# Patient Record
Sex: Male | Born: 1960
Health system: Southern US, Community
[De-identification: ages and names within clinical notes are randomized; demographics above are authoritative.]

## PROBLEM LIST (undated history)

## (undated) HISTORY — PX: OTHER SURGICAL HISTORY: SHX169

---

## 2002-08-02 ENCOUNTER — Encounter: Admission: RE | Admit: 2002-08-02 | Discharge: 2002-08-02 | Payer: Self-pay | Admitting: Family Medicine

## 2016-11-20 ENCOUNTER — Ambulatory Visit
Admission: RE | Admit: 2016-11-20 | Discharge: 2016-11-20 | Disposition: A | Discharge status: Home / Self Care | Payer: Self-pay | Source: Ambulatory Visit | Attending: Family Medicine | Admitting: Family Medicine

## 2016-11-20 ENCOUNTER — Other Ambulatory Visit: Payer: Self-pay | Admitting: Family Medicine

## 2016-11-20 DIAGNOSIS — R42 Dizziness and giddiness: Secondary | ICD-10-CM | POA: Diagnosis not present

## 2016-11-20 DIAGNOSIS — M542 Cervicalgia: Secondary | ICD-10-CM

## 2018-03-30 DIAGNOSIS — M545 Low back pain: Secondary | ICD-10-CM | POA: Diagnosis not present

## 2018-03-30 DIAGNOSIS — M62838 Other muscle spasm: Secondary | ICD-10-CM | POA: Diagnosis not present

## 2018-04-12 IMAGING — CR DG CERVICAL SPINE COMPLETE 4+V
7 series · 7 of 7 positions shown · non-contrast
Comparison: None.

CLINICAL DATA: Neck pain

EXAM:
CERVICAL SPINE - COMPLETE 4+ VIEW

[w cervical spine lat]
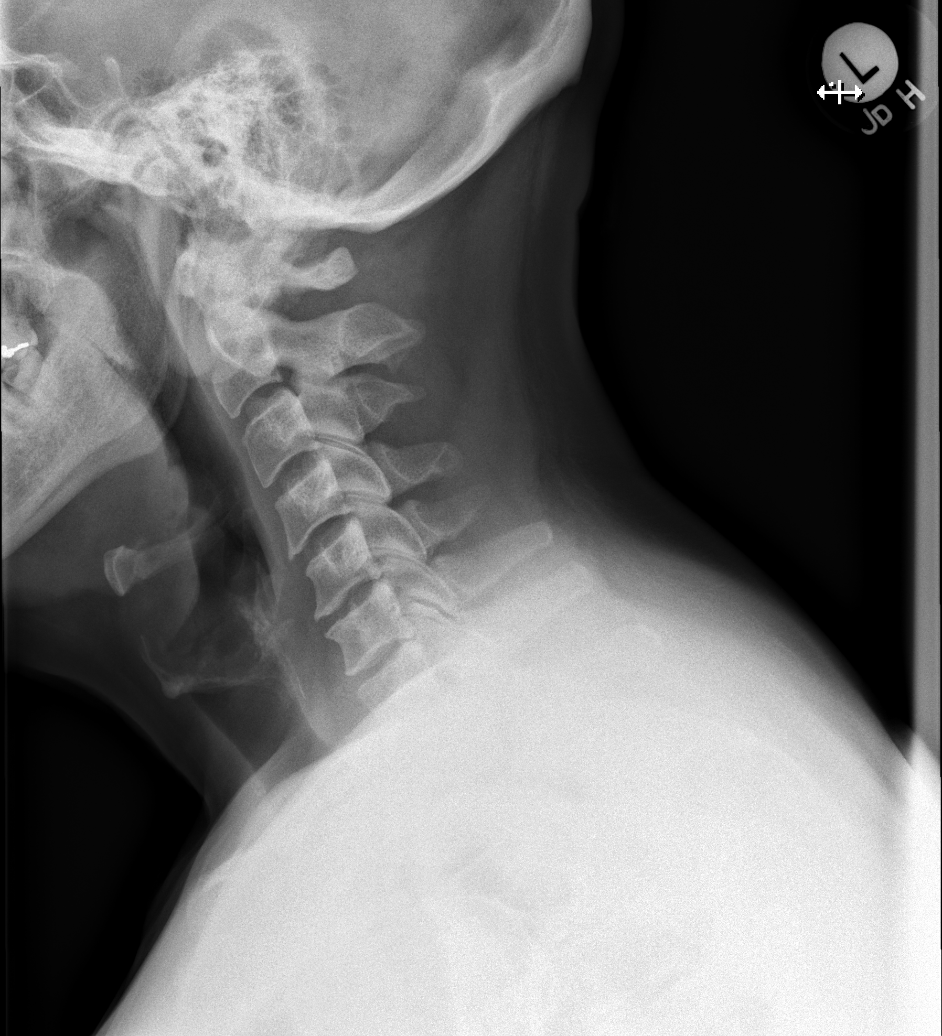

[w cervical spine ap_obl (1 of 2)]
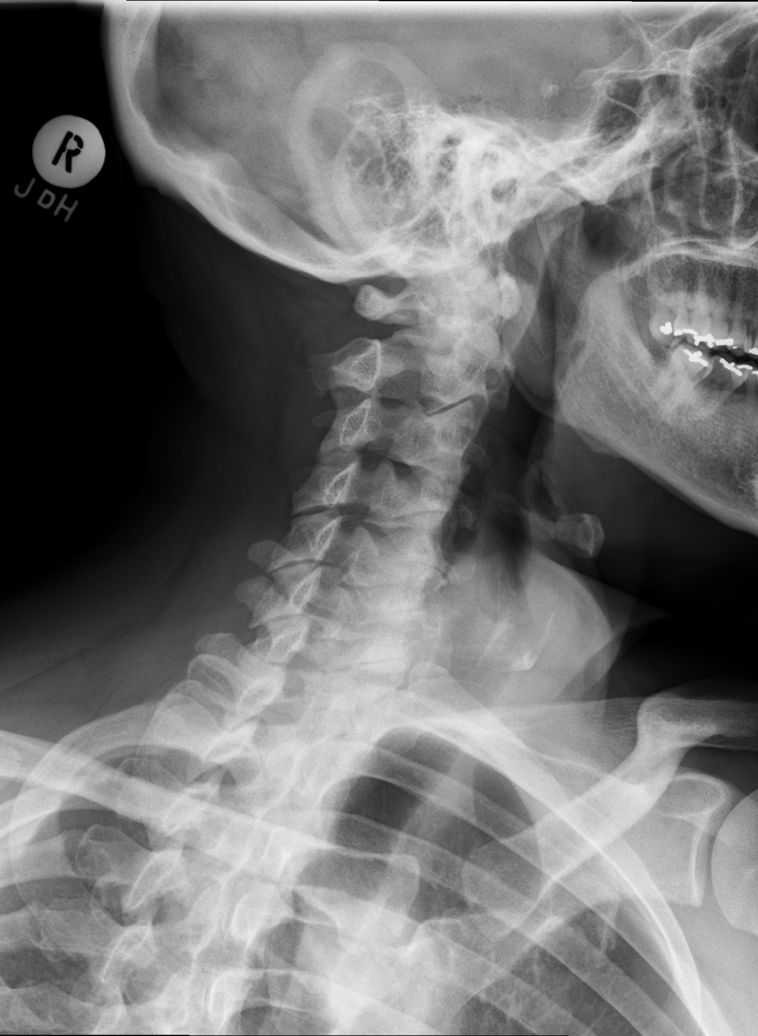

[w cervical spine ap_obl (2 of 2)]
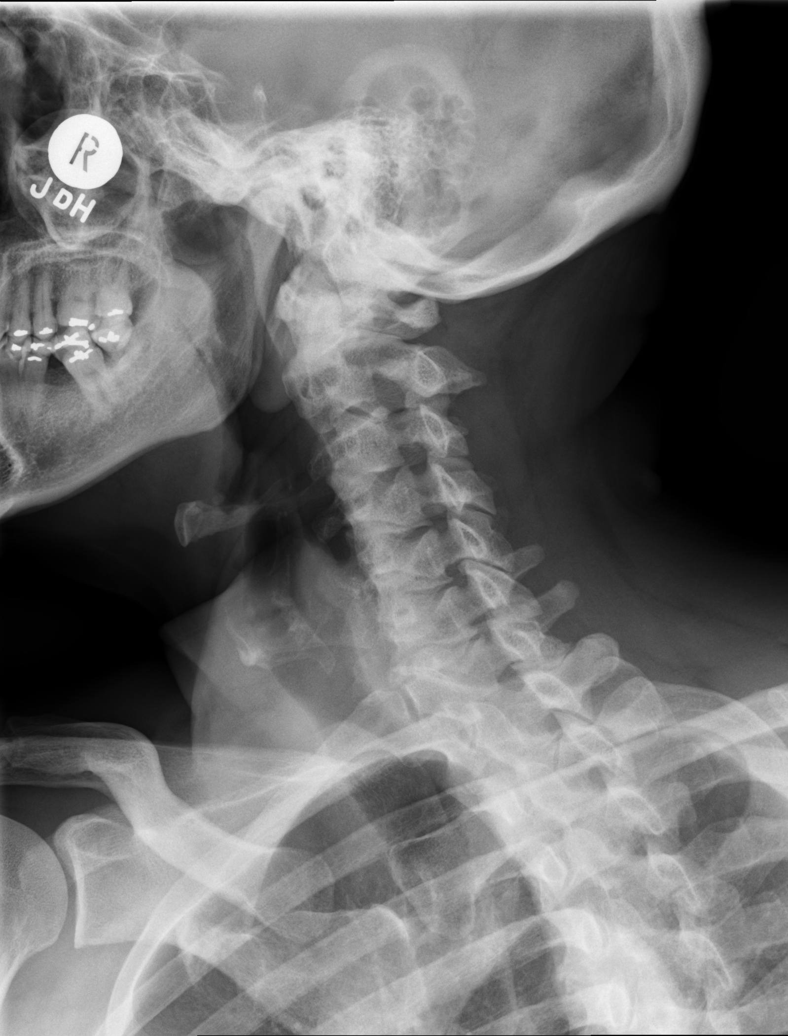

[w cervical spine ap]
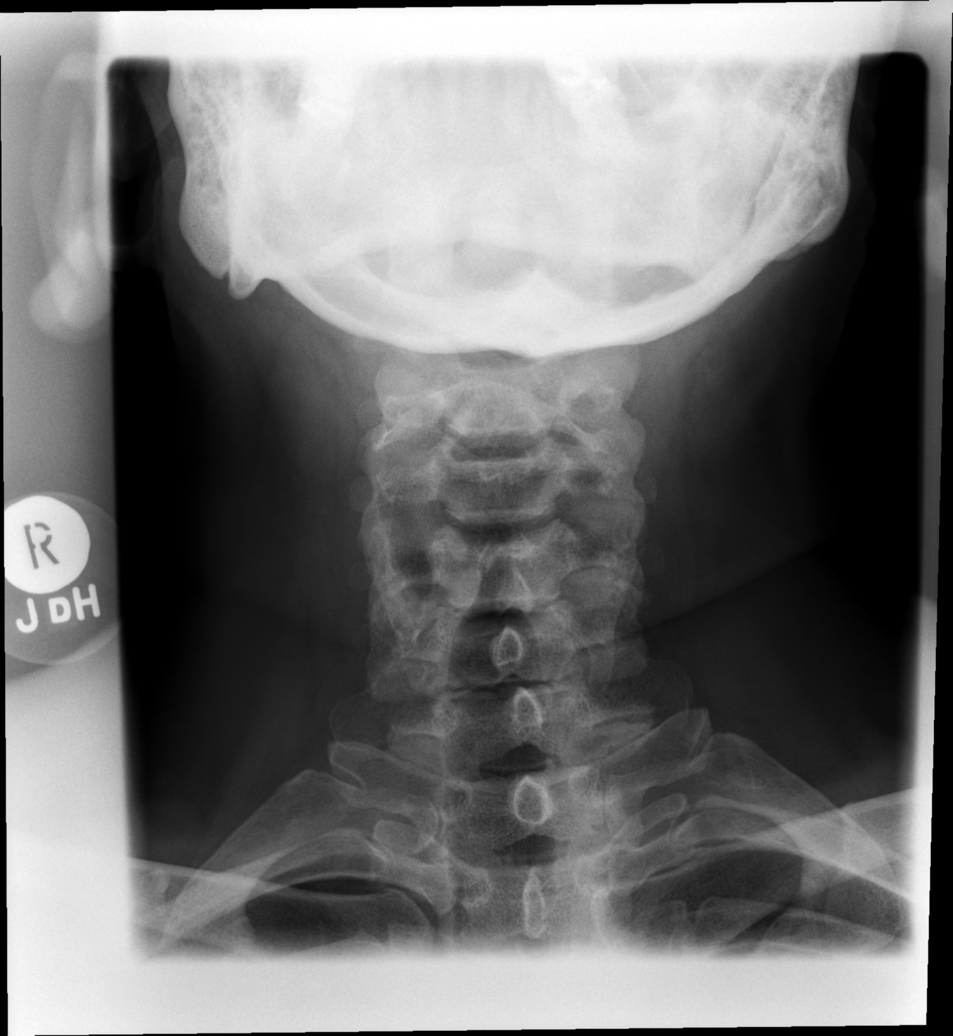

[w cervical swimmers]
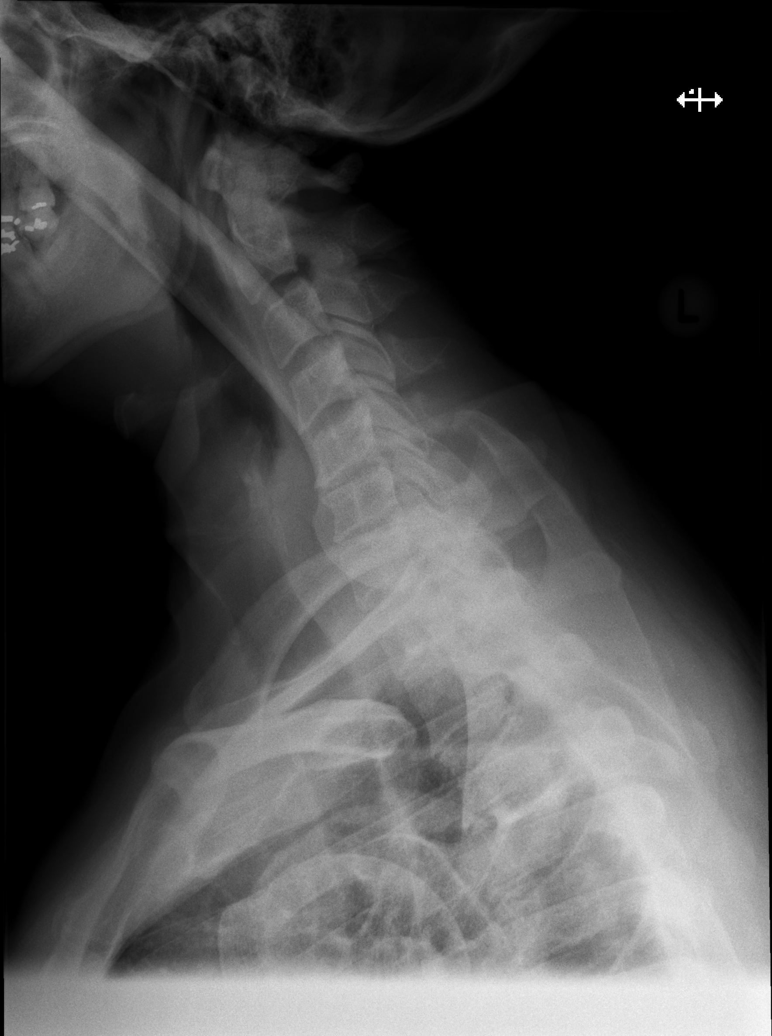

[t cervical spine odontoid (1 of 2)]
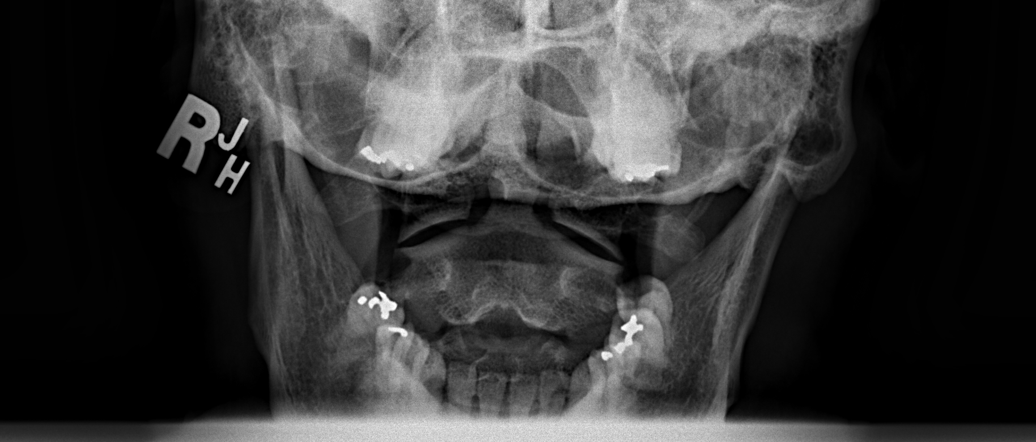

[t cervical spine odontoid (2 of 2)]
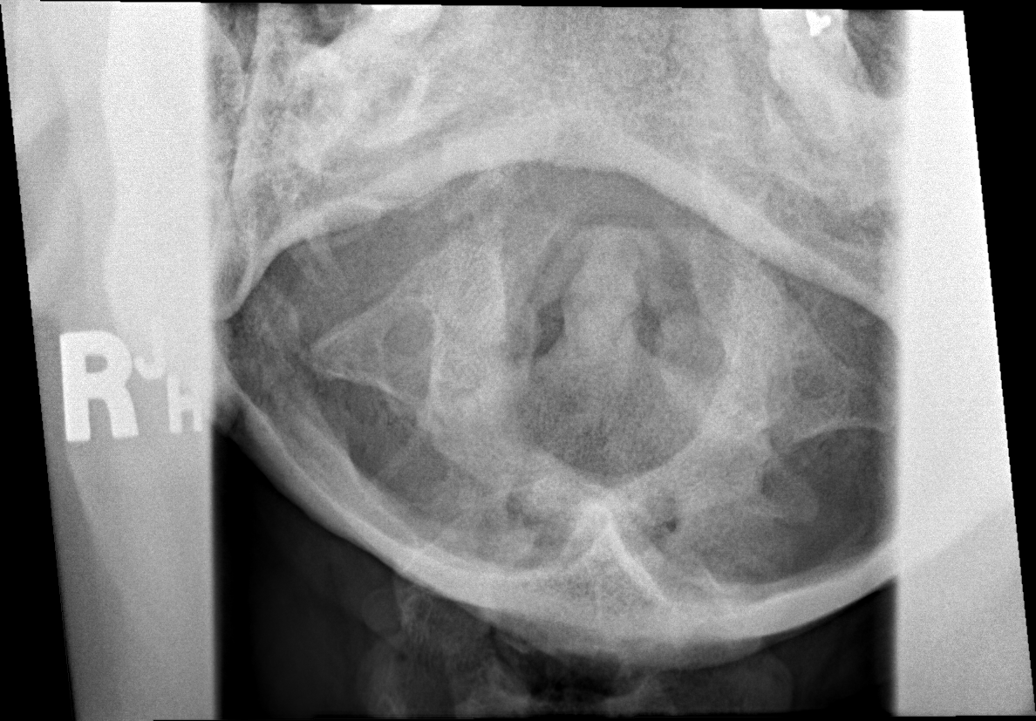

[7 of 7 positions shown; findings below may reference images not displayed]

FINDINGS: Mild disc narrowing and endplate spurring at C5-6 and C6-7. Minimal
facet spurring at the same levels. At least mild osseous foraminal
narrowing bilaterally at C5-6 and C6-7. No fracture deformity or
endplate erosion. No evidence of focal bone lesion. No prevertebral
thickening. Apical lungs are clear.
IMPRESSION: 1. No acute finding.
2. Mild degenerative spurring at C5-6 and C6-7 with mild bilateral
foraminal narrowing.

## 2018-10-20 DIAGNOSIS — M545 Low back pain: Secondary | ICD-10-CM | POA: Diagnosis not present

## 2018-10-25 DIAGNOSIS — R102 Pelvic and perineal pain: Secondary | ICD-10-CM | POA: Diagnosis not present

## 2018-11-02 DIAGNOSIS — M5416 Radiculopathy, lumbar region: Secondary | ICD-10-CM | POA: Diagnosis not present

## 2018-11-16 DIAGNOSIS — J301 Allergic rhinitis due to pollen: Secondary | ICD-10-CM | POA: Diagnosis not present

## 2018-11-16 DIAGNOSIS — M5416 Radiculopathy, lumbar region: Secondary | ICD-10-CM | POA: Diagnosis not present

## 2019-01-13 DIAGNOSIS — Z0000 Encounter for general adult medical examination without abnormal findings: Secondary | ICD-10-CM | POA: Diagnosis not present

## 2019-01-13 DIAGNOSIS — E782 Mixed hyperlipidemia: Secondary | ICD-10-CM | POA: Diagnosis not present

## 2019-01-13 DIAGNOSIS — G5602 Carpal tunnel syndrome, left upper limb: Secondary | ICD-10-CM | POA: Diagnosis not present

## 2019-01-13 DIAGNOSIS — Z125 Encounter for screening for malignant neoplasm of prostate: Secondary | ICD-10-CM | POA: Diagnosis not present

## 2019-04-06 DIAGNOSIS — A084 Viral intestinal infection, unspecified: Secondary | ICD-10-CM | POA: Diagnosis not present

## 2019-04-06 DIAGNOSIS — R197 Diarrhea, unspecified: Secondary | ICD-10-CM | POA: Diagnosis not present

## 2019-05-09 DIAGNOSIS — R0781 Pleurodynia: Secondary | ICD-10-CM | POA: Diagnosis not present

## 2019-06-28 DIAGNOSIS — Z23 Encounter for immunization: Secondary | ICD-10-CM | POA: Diagnosis not present

## 2019-08-25 DIAGNOSIS — R509 Fever, unspecified: Secondary | ICD-10-CM | POA: Diagnosis not present

## 2019-08-25 DIAGNOSIS — R52 Pain, unspecified: Secondary | ICD-10-CM | POA: Diagnosis not present

## 2019-08-25 DIAGNOSIS — R6883 Chills (without fever): Secondary | ICD-10-CM | POA: Diagnosis not present

## 2019-08-25 DIAGNOSIS — J0100 Acute maxillary sinusitis, unspecified: Secondary | ICD-10-CM | POA: Diagnosis not present

## 2019-09-15 ENCOUNTER — Other Ambulatory Visit: Payer: Self-pay

## 2019-09-15 DIAGNOSIS — Z20822 Contact with and (suspected) exposure to covid-19: Secondary | ICD-10-CM

## 2019-09-18 LAB — NOVEL CORONAVIRUS, NAA: SARS-CoV-2, NAA: NOT DETECTED

## 2020-01-16 DIAGNOSIS — R197 Diarrhea, unspecified: Secondary | ICD-10-CM | POA: Diagnosis not present

## 2020-01-16 DIAGNOSIS — Z566 Other physical and mental strain related to work: Secondary | ICD-10-CM | POA: Diagnosis not present

## 2020-05-04 DIAGNOSIS — Z125 Encounter for screening for malignant neoplasm of prostate: Secondary | ICD-10-CM | POA: Diagnosis not present

## 2020-05-04 DIAGNOSIS — R1314 Dysphagia, pharyngoesophageal phase: Secondary | ICD-10-CM | POA: Diagnosis not present

## 2020-05-04 DIAGNOSIS — K2100 Gastro-esophageal reflux disease with esophagitis, without bleeding: Secondary | ICD-10-CM | POA: Diagnosis not present

## 2020-05-22 DIAGNOSIS — R1314 Dysphagia, pharyngoesophageal phase: Secondary | ICD-10-CM | POA: Diagnosis not present

## 2020-05-22 DIAGNOSIS — M79604 Pain in right leg: Secondary | ICD-10-CM | POA: Diagnosis not present

## 2020-05-22 DIAGNOSIS — R768 Other specified abnormal immunological findings in serum: Secondary | ICD-10-CM | POA: Diagnosis not present

## 2020-05-22 DIAGNOSIS — K2100 Gastro-esophageal reflux disease with esophagitis, without bleeding: Secondary | ICD-10-CM | POA: Diagnosis not present

## 2020-06-25 DIAGNOSIS — K219 Gastro-esophageal reflux disease without esophagitis: Secondary | ICD-10-CM | POA: Diagnosis not present

## 2020-06-25 DIAGNOSIS — R1310 Dysphagia, unspecified: Secondary | ICD-10-CM | POA: Diagnosis not present

## 2020-06-25 DIAGNOSIS — Z01818 Encounter for other preprocedural examination: Secondary | ICD-10-CM | POA: Diagnosis not present

## 2020-07-23 DIAGNOSIS — Z1159 Encounter for screening for other viral diseases: Secondary | ICD-10-CM | POA: Diagnosis not present

## 2020-07-26 DIAGNOSIS — D123 Benign neoplasm of transverse colon: Secondary | ICD-10-CM | POA: Diagnosis not present

## 2020-07-26 DIAGNOSIS — R12 Heartburn: Secondary | ICD-10-CM | POA: Diagnosis not present

## 2020-07-26 DIAGNOSIS — K5730 Diverticulosis of large intestine without perforation or abscess without bleeding: Secondary | ICD-10-CM | POA: Diagnosis not present

## 2020-07-26 DIAGNOSIS — R1310 Dysphagia, unspecified: Secondary | ICD-10-CM | POA: Diagnosis not present

## 2020-07-26 DIAGNOSIS — K2900 Acute gastritis without bleeding: Secondary | ICD-10-CM | POA: Diagnosis not present

## 2020-07-26 DIAGNOSIS — Z1211 Encounter for screening for malignant neoplasm of colon: Secondary | ICD-10-CM | POA: Diagnosis not present

## 2020-07-26 DIAGNOSIS — K635 Polyp of colon: Secondary | ICD-10-CM | POA: Diagnosis not present

## 2020-07-26 DIAGNOSIS — K219 Gastro-esophageal reflux disease without esophagitis: Secondary | ICD-10-CM | POA: Diagnosis not present

## 2020-07-26 DIAGNOSIS — K640 First degree hemorrhoids: Secondary | ICD-10-CM | POA: Diagnosis not present

## 2020-09-05 DIAGNOSIS — Z03818 Encounter for observation for suspected exposure to other biological agents ruled out: Secondary | ICD-10-CM | POA: Diagnosis not present

## 2020-09-05 DIAGNOSIS — Z20822 Contact with and (suspected) exposure to covid-19: Secondary | ICD-10-CM | POA: Diagnosis not present

## 2020-09-25 DIAGNOSIS — Z20822 Contact with and (suspected) exposure to covid-19: Secondary | ICD-10-CM | POA: Diagnosis not present

## 2020-10-11 DIAGNOSIS — Z20822 Contact with and (suspected) exposure to covid-19: Secondary | ICD-10-CM | POA: Diagnosis not present

## 2021-06-17 ENCOUNTER — Other Ambulatory Visit: Payer: Self-pay

## 2021-06-17 ENCOUNTER — Encounter: Payer: Self-pay | Admitting: Internal Medicine

## 2021-06-17 ENCOUNTER — Ambulatory Visit: Payer: BC Managed Care – PPO | Admitting: Internal Medicine

## 2021-06-17 VITALS — BP 122/80 | HR 62 | Temp 98.7°F | Resp 18 | Ht 66.0 in | Wt 200.0 lb

## 2021-06-17 DIAGNOSIS — Z0000 Encounter for general adult medical examination without abnormal findings: Secondary | ICD-10-CM

## 2021-06-17 LAB — COMPREHENSIVE METABOLIC PANEL
ALT: 19 U/L (ref 0–53)
AST: 19 U/L (ref 0–37)
Albumin: 4.2 g/dL (ref 3.5–5.2)
Alkaline Phosphatase: 70 U/L (ref 39–117)
BUN: 18 mg/dL (ref 6–23)
CO2: 29 mEq/L (ref 19–32)
Calcium: 9.1 mg/dL (ref 8.4–10.5)
Chloride: 104 mEq/L (ref 96–112)
Creatinine, Ser: 0.75 mg/dL (ref 0.40–1.50)
GFR: 97.59 mL/min (ref >60.00)
Glucose, Bld: 101 mg/dL — ABNORMAL HIGH (ref 70–99)
Potassium: 3.5 mEq/L (ref 3.5–5.1)
Sodium: 139 mEq/L (ref 135–145)
Total Bilirubin: 0.9 mg/dL (ref 0.2–1.2)
Total Protein: 6.7 g/dL (ref 6.0–8.3)

## 2021-06-17 LAB — CBC
HCT: 38.5 % — ABNORMAL LOW (ref 39.0–52.0)
Hemoglobin: 13.1 g/dL (ref 13.0–17.0)
MCHC: 34 g/dL (ref 30.0–36.0)
MCV: 82.9 fl (ref 78.0–100.0)
Platelets: 180 10*3/uL (ref 150.0–400.0)
RBC: 4.65 Mil/uL (ref 4.22–5.81)
RDW: 13.6 % (ref 11.5–15.5)
WBC: 4.8 10*3/uL (ref 4.0–10.5)

## 2021-06-17 LAB — LIPID PANEL
Cholesterol: 170 mg/dL (ref 0–200)
HDL: 46.1 mg/dL (ref >39.00)
NonHDL: 124.28
Total CHOL/HDL Ratio: 4
Triglycerides: 231 mg/dL — ABNORMAL HIGH (ref 0.0–149.0)
VLDL: 46.2 mg/dL — ABNORMAL HIGH (ref 0.0–40.0)

## 2021-06-17 LAB — LDL CHOLESTEROL, DIRECT: Direct LDL: 98 mg/dL

## 2021-06-17 LAB — HEMOGLOBIN A1C: Hgb A1c MFr Bld: 5.5 % (ref 4.6–6.5)

## 2021-06-17 NOTE — Assessment & Plan Note (Signed)
Flu shot yearly. Covid-19 booster counseled. Shingrix complete. Tetanus up to date per his recollection. Colonoscopy up to date will get records. Counseled about sun safety and mole surveillance. Counseled about the dangers of distracted driving. Given 10 year screening recommendations.

## 2021-06-17 NOTE — Patient Instructions (Addendum)
We will check the labs today. 

## 2021-06-17 NOTE — Progress Notes (Signed)
   Subjective:   Patient ID: Jordan Villa, male    DOB: June 11, 1961, 60 y.o.   MRN: [id number]  HPI The patient is a new 60 YO man coming in for wellness.   PMH, [facility], social history reviewed and updated  Review of Systems  Constitutional: Negative.   HENT: Negative.    Eyes: Negative.   Respiratory:  Negative for cough, chest tightness and shortness of breath.   Cardiovascular:  Negative for chest pain, palpitations and leg swelling.  Gastrointestinal:  Negative for abdominal distention, abdominal pain, constipation, diarrhea, nausea and vomiting.  Musculoskeletal: Negative.   Skin: Negative.   Neurological: Negative.   Psychiatric/Behavioral: Negative.     Objective:  Physical Exam Constitutional:      Appearance: He is well-developed.  HENT:     Head: Normocephalic and atraumatic.  Cardiovascular:     Rate and Rhythm: Normal rate and regular rhythm.  Pulmonary:     Effort: Pulmonary effort is normal. No respiratory distress.     Breath sounds: Normal breath sounds. No wheezing or rales.  Abdominal:     General: Bowel sounds are normal. There is no distension.     Palpations: Abdomen is soft.     Tenderness: There is no abdominal tenderness. There is no rebound.  Musculoskeletal:     Cervical back: Normal range of motion.  Skin:    General: Skin is warm and dry.  Neurological:     Mental Status: He is alert and oriented to person, place, and time.     Coordination: Coordination normal.    Vitals:   06/17/21 1412  BP: 122/80  Pulse: 62  Resp: 18  Temp: 98.7 F (37.1 C)  TempSrc: Oral  SpO2: 98%  Weight: 200 lb (90.7 kg)  Height: 5\' 6"  (1.676 m)    This visit occurred during the SARS-CoV-2 public health emergency.  Safety protocols were in place, including screening questions prior to the visit, additional usage of staff PPE, and extensive cleaning of exam room while observing appropriate contact time as indicated for disinfecting solutions.   Assessment &  Plan:

## 2021-09-02 DIAGNOSIS — Z20822 Contact with and (suspected) exposure to covid-19: Secondary | ICD-10-CM | POA: Diagnosis not present

## 2022-03-25 ENCOUNTER — Ambulatory Visit: Payer: BC Managed Care – PPO | Admitting: Internal Medicine

## 2022-03-28 ENCOUNTER — Ambulatory Visit: Payer: BC Managed Care – PPO | Admitting: Internal Medicine

## 2022-03-31 ENCOUNTER — Ambulatory Visit: Payer: BC Managed Care – PPO | Admitting: Internal Medicine

## 2022-03-31 ENCOUNTER — Encounter: Payer: Self-pay | Admitting: Internal Medicine

## 2022-03-31 VITALS — BP 112/70 | HR 52 | Resp 18 | Ht 66.0 in | Wt 196.8 lb

## 2022-03-31 DIAGNOSIS — M542 Cervicalgia: Secondary | ICD-10-CM | POA: Diagnosis not present

## 2022-03-31 DIAGNOSIS — G8929 Other chronic pain: Secondary | ICD-10-CM

## 2022-03-31 MED ORDER — PREDNISONE 20 MG PO TABS: 40.0000 mg | ORAL_TABLET | Freq: Every day | ORAL | 0 refills | Status: DC
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
## 2022-04-03 DIAGNOSIS — G8929 Other chronic pain: Secondary | ICD-10-CM | POA: Insufficient documentation

## 2022-04-03 NOTE — Assessment & Plan Note (Signed)
Rx prednisone 40 mg daily 5 day course and referral to neurosurgery as this is an acute on chronic problem. Has past imaging will not update today. No red flag signs such as weakness or numbness in the hands/arms.

## 2022-05-02 DIAGNOSIS — M542 Cervicalgia: Secondary | ICD-10-CM | POA: Diagnosis not present

## 2022-05-05 DIAGNOSIS — Z03818 Encounter for observation for suspected exposure to other biological agents ruled out: Secondary | ICD-10-CM | POA: Diagnosis not present

## 2022-05-05 DIAGNOSIS — J029 Acute pharyngitis, unspecified: Secondary | ICD-10-CM | POA: Diagnosis not present

## 2022-05-05 DIAGNOSIS — J069 Acute upper respiratory infection, unspecified: Secondary | ICD-10-CM | POA: Diagnosis not present

## 2022-05-05 DIAGNOSIS — Z681 Body mass index (BMI) 19 or less, adult: Secondary | ICD-10-CM | POA: Diagnosis not present

## 2022-05-07 DIAGNOSIS — U071 COVID-19: Secondary | ICD-10-CM | POA: Diagnosis not present

## 2022-05-07 DIAGNOSIS — J3489 Other specified disorders of nose and nasal sinuses: Secondary | ICD-10-CM | POA: Diagnosis not present

## 2022-05-07 DIAGNOSIS — R0981 Nasal congestion: Secondary | ICD-10-CM | POA: Diagnosis not present

## 2022-05-07 DIAGNOSIS — R051 Acute cough: Secondary | ICD-10-CM | POA: Diagnosis not present

## 2022-05-08 ENCOUNTER — Telehealth: Payer: BC Managed Care – PPO | Admitting: Family Medicine

## 2022-05-14 ENCOUNTER — Ambulatory Visit: Payer: BC Managed Care – PPO

## 2022-05-28 ENCOUNTER — Other Ambulatory Visit: Payer: Self-pay

## 2022-05-28 ENCOUNTER — Ambulatory Visit: Payer: BC Managed Care – PPO | Attending: Neurosurgery

## 2022-05-28 DIAGNOSIS — R252 Cramp and spasm: Secondary | ICD-10-CM | POA: Diagnosis not present

## 2022-05-28 DIAGNOSIS — M542 Cervicalgia: Secondary | ICD-10-CM | POA: Insufficient documentation

## 2022-05-28 DIAGNOSIS — R293 Abnormal posture: Secondary | ICD-10-CM | POA: Diagnosis not present
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
## 2022-06-11 ENCOUNTER — Ambulatory Visit: Payer: BC Managed Care – PPO | Attending: Neurosurgery

## 2022-06-11 DIAGNOSIS — M542 Cervicalgia: Secondary | ICD-10-CM | POA: Insufficient documentation

## 2022-06-11 DIAGNOSIS — R293 Abnormal posture: Secondary | ICD-10-CM | POA: Insufficient documentation

## 2022-06-11 DIAGNOSIS — R252 Cramp and spasm: Secondary | ICD-10-CM | POA: Insufficient documentation
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
## 2022-06-17 ENCOUNTER — Ambulatory Visit: Payer: BC Managed Care – PPO

## 2022-06-17 DIAGNOSIS — R293 Abnormal posture: Secondary | ICD-10-CM

## 2022-06-17 DIAGNOSIS — M542 Cervicalgia: Secondary | ICD-10-CM

## 2022-06-17 DIAGNOSIS — R252 Cramp and spasm: Secondary | ICD-10-CM | POA: Diagnosis not present
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
## 2022-06-19 ENCOUNTER — Ambulatory Visit: Payer: BC Managed Care – PPO | Admitting: Physical Therapy

## 2022-06-19 DIAGNOSIS — R293 Abnormal posture: Secondary | ICD-10-CM | POA: Diagnosis not present

## 2022-06-19 DIAGNOSIS — R252 Cramp and spasm: Secondary | ICD-10-CM

## 2022-06-19 DIAGNOSIS — M542 Cervicalgia: Secondary | ICD-10-CM
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
## 2022-06-23 ENCOUNTER — Encounter: Payer: Self-pay | Admitting: Internal Medicine

## 2022-06-23 ENCOUNTER — Ambulatory Visit (INDEPENDENT_AMBULATORY_CARE_PROVIDER_SITE_OTHER): Payer: BC Managed Care – PPO | Admitting: Internal Medicine

## 2022-06-23 VITALS — BP 118/60 | HR 72 | Ht 65.5 in | Wt 195.0 lb

## 2022-06-23 DIAGNOSIS — Z23 Encounter for immunization: Secondary | ICD-10-CM | POA: Diagnosis not present

## 2022-06-23 DIAGNOSIS — Z0000 Encounter for general adult medical examination without abnormal findings: Secondary | ICD-10-CM | POA: Diagnosis not present

## 2022-06-23 NOTE — Progress Notes (Signed)
   Subjective:   Patient ID: Jordan Villa, male    DOB: 1961-02-11, 61 y.o.   MRN: [id number]  HPI The patient is here for physical.  PMH, [facility], social history reviewed and updated  Review of Systems  Objective:  Physical Exam  Vitals:   06/23/22 1509  BP: 118/60  Pulse: 72  SpO2: 98%  Weight: 195 lb (88.5 kg)  Height: 5' 5.5" (1.664 m)    Assessment & Plan:  Flu and shingrix IM given at visit

## 2022-06-24 ENCOUNTER — Ambulatory Visit: Payer: BC Managed Care – PPO

## 2022-06-24 DIAGNOSIS — M542 Cervicalgia: Secondary | ICD-10-CM | POA: Diagnosis not present

## 2022-06-24 DIAGNOSIS — R252 Cramp and spasm: Secondary | ICD-10-CM | POA: Diagnosis not present

## 2022-06-24 DIAGNOSIS — R293 Abnormal posture: Secondary | ICD-10-CM
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
## 2022-06-26 NOTE — Assessment & Plan Note (Signed)
Flu shot given. Covid-19 counseled. Shingrix given 1st. Tetanus up to date. Colonoscopy up to date. Counseled about sun safety and mole surveillance. Counseled about the dangers of distracted driving. Given 10 year screening recommendations.

## 2022-07-14 DIAGNOSIS — M542 Cervicalgia: Secondary | ICD-10-CM | POA: Diagnosis not present

## 2022-07-14 DIAGNOSIS — Z6832 Body mass index (BMI) 32.0-32.9, adult: Secondary | ICD-10-CM | POA: Diagnosis not present

## 2022-08-25 ENCOUNTER — Telehealth: Payer: BC Managed Care – PPO | Admitting: Physician Assistant

## 2022-08-25 DIAGNOSIS — U071 COVID-19: Secondary | ICD-10-CM

## 2022-08-25 MED ORDER — MOLNUPIRAVIR EUA 200MG CAPSULE: 4.0000 | ORAL_CAPSULE | Freq: Two times a day (BID) | ORAL | 0 refills | Status: AC

## 2022-08-25 MED ORDER — PROMETHAZINE-DM 6.25-15 MG/5ML PO SYRP: 5.0000 mL | ORAL_SOLUTION | Freq: Four times a day (QID) | ORAL | 0 refills | Status: DC | PRN

## 2022-08-25 NOTE — Progress Notes (Signed)
Virtual Visit Consent   Jordan Villa, you are scheduled for a virtual visit with a [facility] provider today. Just as with appointments in the office, your consent must be obtained to participate. Your consent will be active for this visit and any virtual visit you may have with one of our providers in the next 365 days. If you have a MyChart account, a copy of this consent can be sent to you electronically.  As this is a virtual visit, video technology does not allow for your provider to perform a traditional examination. This may limit your provider's ability to fully assess your condition. If your provider identifies any concerns that need to be evaluated in person or the need to arrange testing (such as labs, EKG, etc.), we will make arrangements to do so. Although advances in technology are sophisticated, we cannot ensure that it will always work on either your end or our end. If the connection with a video visit is poor, the visit may have to be switched to a telephone visit. With either a video or telephone visit, we are not always able to ensure that we have a secure connection.  By engaging in this virtual visit, you consent to the provision of healthcare and authorize for your insurance to be billed (if applicable) for the services provided during this visit. Depending on your insurance coverage, you may receive a charge related to this service.  I need to obtain your verbal consent now. Are you willing to proceed with your visit today? Jordan Villa has provided verbal consent on 08/25/2022 for a virtual visit (video or telephone). Margaretann Loveless, PA-C  Date: 08/25/2022 1:22 PM  Virtual Visit via Video Note   I, Margaretann Loveless, connected with  Jordan Villa  ([id number], Jun 21, 1961) on 08/25/22 at  1:15 PM EST by a video-enabled telemedicine application and verified that I am speaking with the correct person using two identifiers.  Location: Patient: Virtual Visit Location  Patient: Home Provider: Virtual Visit Location Provider: Home Office   I discussed the limitations of evaluation and management by telemedicine and the availability of in person appointments. The patient expressed understanding and agreed to proceed.    History of Present Illness: Jordan Villa is a 61 y.o. who identifies as a male who was assigned male at birth, and is being seen today for Covid 43.  HPI: URI  This is a new problem. The current episode started in the past 7 days (Tested positive on at home test today for Covid 19; symptoms started on Friday). The problem has been gradually worsening. Maximum temperature: Subjective fevers. Associated symptoms include congestion, coughing (productive just to today), ear pain, headaches, a plugged ear sensation, rhinorrhea and sinus pain. Pertinent negatives include no diarrhea, nausea, sneezing, sore throat, vomiting or wheezing. Associated symptoms comments: Myalgias, chills. He has tried acetaminophen for the symptoms. The treatment provided no relief.     Problems:  Patient Active Problem List   Diagnosis Date Noted   Chronic neck pain 04/03/2022   Routine general medical examination at a health care facility 06/17/2021    Allergies: No Known Allergies Medications:  Current Outpatient Medications:    molnupiravir EUA (LAGEVRIO) 200 mg CAPS capsule, Take 4 capsules (800 mg total) by mouth 2 (two) times daily for 5 days., Disp: 40 capsule, Rfl: 0   promethazine-dextromethorphan (PROMETHAZINE-DM) 6.25-15 MG/5ML syrup, Take 5 mLs by mouth 4 (four) times daily as needed., Disp: 118 mL, Rfl: 0  Observations/Objective: Patient is well-developed, well-nourished in no acute distress.  Resting comfortably at home.  Head is normocephalic, atraumatic.  No labored breathing.  Speech is clear and coherent with logical content.  Patient is alert and oriented at baseline.    Assessment and Plan: 1. COVID-19 - molnupiravir EUA (LAGEVRIO) 200 mg  CAPS capsule; Take 4 capsules (800 mg total) by mouth 2 (two) times daily for 5 days.  Dispense: 40 capsule; Refill: 0 - promethazine-dextromethorphan (PROMETHAZINE-DM) 6.25-15 MG/5ML syrup; Take 5 mLs by mouth 4 (four) times daily as needed.  Dispense: 118 mL; Refill: 0  - Continue OTC symptomatic management of choice - Will send OTC vitamins and supplement information through AVS - Molnupiravir and Promethazine DM prescribed - Patient enrolled in MyChart symptom monitoring - Push fluids - Rest as needed - Discussed return precautions and when to seek in-person evaluation, sent via AVS as well   Follow Up Instructions: I discussed the assessment and treatment plan with the patient. The patient was provided an opportunity to ask questions and all were answered. The patient agreed with the plan and demonstrated an understanding of the instructions.  A copy of instructions were sent to the patient via MyChart unless otherwise noted below.    The patient was advised to call back or seek an in-person evaluation if the symptoms worsen or if the condition fails to improve as anticipated.  Time:  I spent 10 minutes with the patient via telehealth technology discussing the above problems/concerns.    Margaretann Loveless, PA-C

## 2022-08-25 NOTE — Patient Instructions (Signed)
Jordan Villa, thank you for joining Margaretann Loveless, PA-C for today's virtual visit.  While this provider is not your primary care provider (PCP), if your PCP is located in our provider database this encounter information will be shared with them immediately following your visit.   A [facility] MyChart account gives you access to today's visit and all your visits, tests, and labs performed at [facility] " click here if you don't have a [facility] MyChart account or go to [email or web address]  Consent: (Patient) Jordan Villa provided verbal consent for this virtual visit at the beginning of the encounter.  Current Medications:  Current Outpatient Medications:    molnupiravir EUA (LAGEVRIO) 200 mg CAPS capsule, Take 4 capsules (800 mg total) by mouth 2 (two) times daily for 5 days., Disp: 40 capsule, Rfl: 0   promethazine-dextromethorphan (PROMETHAZINE-DM) 6.25-15 MG/5ML syrup, Take 5 mLs by mouth 4 (four) times daily as needed., Disp: 118 mL, Rfl: 0   Medications ordered in this encounter:  Meds ordered this encounter  Medications   molnupiravir EUA (LAGEVRIO) 200 mg CAPS capsule    Sig: Take 4 capsules (800 mg total) by mouth 2 (two) times daily for 5 days.    Dispense:  40 capsule    Refill:  0    Order Specific Question:   Supervising Provider    Answer:   Merrilee Jansky [id number]   promethazine-dextromethorphan (PROMETHAZINE-DM) 6.25-15 MG/5ML syrup    Sig: Take 5 mLs by mouth 4 (four) times daily as needed.    Dispense:  118 mL    Refill:  0    Order Specific Question:   Supervising Provider    Answer:   Merrilee Jansky [[id number]]     *If you need refills on other medications prior to your next appointment, please contact your pharmacy*  Follow-Up: Call back or seek an in-person evaluation if the symptoms worsen or if the condition fails to improve as anticipated.  [facility] [phone]  Other  Instructions  COVID-19 COVID-19, or coronavirus disease 2019, is an infection that is caused by a new (novel) coronavirus called SARS-CoV-2. COVID-19 can cause many symptoms. In some people, the virus may not cause any symptoms. In others, it may cause mild or severe symptoms. Some people with severe infection develop severe disease. What are the causes? This illness is caused by a virus. The virus may be in the air as tiny specks of fluid (aerosols) or droplets, or it may be on surfaces. You may catch the virus by: Breathing in droplets from an infected person. Droplets can be spread by a person breathing, speaking, singing, coughing, or sneezing. Touching something, like a table or a doorknob, that has virus on it (is contaminated) and then touching your mouth, nose, or eyes. What increases the risk? Risk for infection: You are more likely to get infected with the COVID-19 virus if: You are within 6 ft (1.8 m) of a person with COVID-19 for 15 minutes or longer. You are providing care for a person who is infected with COVID-19. You are in close personal contact with other people. Close personal contact includes hugging, kissing, or sharing eating or drinking utensils. Risk for serious illness caused by COVID-19: You are more likely to get seriously ill from the COVID-19 virus if: You have cancer. You have a long-term (chronic) disease, such as: Chronic lung disease. This includes pulmonary embolism, chronic obstructive pulmonary disease, and cystic fibrosis. Long-term disease that lowers  your body's ability to fight infection (immunocompromise). Serious cardiac conditions, such as heart failure, coronary artery disease, or cardiomyopathy. Diabetes. Chronic kidney disease. Liver diseases. These include cirrhosis, nonalcoholic fatty liver disease, alcoholic liver disease, or autoimmune hepatitis. You have obesity. You are pregnant or were recently pregnant. You have sickle cell  disease. What are the signs or symptoms? Symptoms of this condition can range from mild to severe. Symptoms may appear any time from 2 to 14 days after being exposed to the virus. They include: Fever or chills. Shortness of breath or trouble breathing. Feeling tired or very tired. Headaches, body aches, or muscle aches. Runny or stuffy nose, sneezing, coughing, or sore throat. New loss of taste or smell. This is rare. Some people may also have stomach problems, such as nausea, vomiting, or diarrhea. Other people may not have any symptoms of COVID-19. How is this diagnosed? This condition may be diagnosed by testing samples to check for the COVID-19 virus. The most common tests are the PCR test and the antigen test. Tests may be done in the lab or at home. They include: Using a swab to take a sample of fluid from the back of your nose and throat (nasopharyngeal fluid), from your nose, or from your throat. Testing a sample of saliva from your mouth. Testing a sample of coughed-up mucus from your lungs (sputum). How is this treated? Treatment for COVID-19 infection depends on the severity of the condition. Mild symptoms can be managed at home with rest, fluids, and over-the-counter medicines. Serious symptoms may be treated in a hospital intensive care unit (ICU). Treatment in the ICU may include: Supplemental oxygen. Extra oxygen is given through a tube in the nose, a face mask, or a hood. Medicines. These may include: Antivirals, such as monoclonal antibodies. These help your body fight off certain viruses that can cause disease. Anti-inflammatories, such as corticosteroids. These reduce inflammation and suppress the immune system. Antithrombotics. These prevent or treat blood clots, if they develop. Convalescent plasma. This helps boost your immune system, if you have an underlying immunosuppressive condition or are getting immunosuppressive treatments. Prone positioning. This means you  will lie on your stomach. This helps oxygen to get into your lungs. Infection control measures. If you are at risk for more serious illness caused by COVID-19, your health care provider may prescribe two long-acting monoclonal antibodies, given together every 6 months. How is this prevented? To protect yourself: Use preventive medicine (pre-exposure prophylaxis). You may get pre-exposure prophylaxis if you have moderate or severe immunocompromise. Get vaccinated. Anyone 15 months old or older who meets guidelines can get a COVID-19 vaccine or vaccine series. This includes people who are pregnant or making breast milk (lactating). Get an added dose of COVID-19 vaccine after your first vaccine or vaccine series if you have moderate to severe immunocompromise. This applies if you have had a solid organ transplant or have been diagnosed with an immunocompromising condition. You should get the added dose 4 weeks after you got the first COVID-19 vaccine or vaccine series. If you get an mRNA vaccine, you will need a 3-dose primary series. If you get the J&J/Janssen vaccine, you will need a 2-dose primary series, with the second dose being an mRNA vaccine. Talk to your health care provider about getting experimental monoclonal antibodies. This treatment is approved under emergency use authorization to prevent severe illness before or after being exposed to the COVID-19 virus. You may be given monoclonal antibodies if: You have moderate or severe immunocompromise.  This includes treatments that lower your immune response. People with immunocompromise may not develop protection against COVID-19 when they are vaccinated. You cannot be vaccinated. You may not get a vaccine if you have a severe allergic reaction to the vaccine or its components. You are not fully vaccinated. You are in a facility where COVID-19 is present and: Are in close contact with a person who is infected with the COVID-19 virus. Are at high  risk of being exposed to the COVID-19 virus. You are at risk of illness from new variants of the COVID-19 virus. To protect others: If you have symptoms of COVID-19, take steps to prevent the virus from spreading to others. Stay home. Leave your house only to get medical care. Do not use public transit, if possible. Do not travel while you are sick. Wash your hands often with soap and water for at least 20 seconds. If soap and water are not available, use alcohol-based hand sanitizer. Make sure that all people in your household wash their hands well and often. Cough or sneeze into a tissue or your sleeve or elbow. Do not cough or sneeze into your hand or into the air. Where to find more information Centers for Disease Control and Prevention: https://www.clark-whitaker.org/ World Health Organization: https://thompson-craig.com/ Get help right away if: You have trouble breathing. You have pain or pressure in your chest. You are confused. You have bluish lips and fingernails. You have trouble waking from sleep. You have symptoms that get worse. These symptoms may be an emergency. Get help right away. Call 911. Do not wait to see if the symptoms will go away. Do not drive yourself to the hospital. Summary COVID-19 is an infection that is caused by a new coronavirus. Sometimes, there are no symptoms. Other times, symptoms range from mild to severe. Some people with a severe COVID-19 infection develop severe disease. The virus that causes COVID-19 can spread from person to person through droplets or aerosols from breathing, speaking, singing, coughing, or sneezing. Mild symptoms of COVID-19 can be managed at home with rest, fluids, and over-the-counter medicines. This information is not intended to replace advice given to you by your health care provider. Make sure you discuss any questions you have with your health care provider. Document Revised: 09/10/2021 Document Reviewed:  09/12/2021 Elsevier Patient Education  2023 Elsevier Inc.    If you have been instructed to have an in-person evaluation today at a local Urgent Care facility, please use the link below. It will take you to a list of all of our available [facility], including address, phone number and hours of operation. Please do not delay care.  [facility]  If you or a family member do not have a primary care provider, use the link below to schedule a visit and establish care. When you choose a [facility] primary care physician or advanced practice provider, you gain a long-term partner in health. Find a Primary Care Provider  Learn more about [facility]'s in-office and virtual care options: [facility] Get Care Now

## 2022-09-22 ENCOUNTER — Ambulatory Visit (INDEPENDENT_AMBULATORY_CARE_PROVIDER_SITE_OTHER): Payer: BC Managed Care – PPO

## 2022-09-22 DIAGNOSIS — Z23 Encounter for immunization: Secondary | ICD-10-CM | POA: Diagnosis not present

## 2022-09-22 NOTE — Progress Notes (Signed)
T was given 2nd shingles vacc w/o any complications.

## 2022-10-28 ENCOUNTER — Ambulatory Visit: Payer: BC Managed Care – PPO | Admitting: Internal Medicine

## 2022-11-04 ENCOUNTER — Ambulatory Visit: Payer: BC Managed Care – PPO | Admitting: Internal Medicine

## 2022-11-04 ENCOUNTER — Encounter: Payer: Self-pay | Admitting: Internal Medicine

## 2022-11-04 VITALS — BP 120/78 | HR 55 | Temp 98.5°F | Ht 65.5 in | Wt 193.0 lb

## 2022-11-04 DIAGNOSIS — K625 Hemorrhage of anus and rectum: Secondary | ICD-10-CM

## 2022-11-04 LAB — CBC
HCT: 39.6 % (ref 39.0–52.0)
Hemoglobin: 13.7 g/dL (ref 13.0–17.0)
MCHC: 34.6 g/dL (ref 30.0–36.0)
MCV: 82.6 fl (ref 78.0–100.0)
Platelets: 190 10*3/uL (ref 150.0–400.0)
RBC: 4.79 Mil/uL (ref 4.22–5.81)
RDW: 13.9 % (ref 11.5–15.5)
WBC: 4.9 10*3/uL (ref 4.0–10.5)

## 2022-11-04 LAB — FERRITIN: Ferritin: 94 ng/mL (ref 22.0–322.0)

## 2022-11-04 NOTE — Assessment & Plan Note (Signed)
Has hemorrhoids which could be cause. Last colonoscopy he thinks 1 year ago at [facility]. We do not have records getting those to check for known diverticulosis. No signs of diverticulitis on exam. Last episode of bleeding about 2 weeks ago. Checking CBC and ferritin. If significant drop will refer back to GI. If not will monitor and review colonoscopy findings.

## 2022-11-04 NOTE — Progress Notes (Signed)
   Subjective:   Patient ID: Jordan Villa, male    DOB: 11/24/1960, 62 y.o.   MRN: [id number]  HPI The patient is a 62 YO man coming in for rectal bleeding twice last time 2 weeks ago. Last colonoscopy 1 year ago due again in 3 years.  Review of Systems  Constitutional: Negative.   HENT: Negative.    Eyes: Negative.   Respiratory:  Negative for cough, chest tightness and shortness of breath.   Cardiovascular:  Negative for chest pain, palpitations and leg swelling.  Gastrointestinal:  Positive for blood in stool. Negative for abdominal distention, abdominal pain, constipation, diarrhea, nausea and vomiting.  Musculoskeletal: Negative.   Skin: Negative.   Neurological: Negative.   Psychiatric/Behavioral: Negative.      Objective:  Physical Exam Constitutional:      Appearance: He is well-developed.  HENT:     Head: Normocephalic and atraumatic.  Cardiovascular:     Rate and Rhythm: Normal rate and regular rhythm.  Pulmonary:     Effort: Pulmonary effort is normal. No respiratory distress.     Breath sounds: Normal breath sounds. No wheezing or rales.  Abdominal:     General: Bowel sounds are normal. There is no distension.     Palpations: Abdomen is soft.     Tenderness: There is no abdominal tenderness. There is no rebound.  Musculoskeletal:     Cervical back: Normal range of motion.  Skin:    General: Skin is warm and dry.  Neurological:     Mental Status: He is alert and oriented to person, place, and time.     Coordination: Coordination normal.     Vitals:   11/04/22 0839  BP: 120/78  Pulse: (!) 55  Temp: 98.5 F (36.9 C)  TempSrc: Oral  SpO2: 98%  Weight: 193 lb (87.5 kg)  Height: 5' 5.5" (1.664 m)    Assessment & Plan:

## 2022-11-04 NOTE — Patient Instructions (Signed)
We will check the labs today and get the records of the colonoscopy.

## 2023-05-21 ENCOUNTER — Encounter (INDEPENDENT_AMBULATORY_CARE_PROVIDER_SITE_OTHER): Payer: Self-pay

## 2023-06-29 ENCOUNTER — Encounter: Payer: BC Managed Care – PPO | Admitting: Internal Medicine

## 2023-07-13 ENCOUNTER — Encounter: Payer: BC Managed Care – PPO | Admitting: Internal Medicine

## 2023-07-27 ENCOUNTER — Encounter: Payer: Self-pay | Admitting: Internal Medicine

## 2023-07-27 ENCOUNTER — Ambulatory Visit: Payer: Self-pay | Admitting: Internal Medicine

## 2024-03-09 ENCOUNTER — Ambulatory Visit (INDEPENDENT_AMBULATORY_CARE_PROVIDER_SITE_OTHER): Payer: Self-pay | Admitting: Internal Medicine

## 2024-03-09 ENCOUNTER — Ambulatory Visit (INDEPENDENT_AMBULATORY_CARE_PROVIDER_SITE_OTHER): Payer: Self-pay

## 2024-03-09 ENCOUNTER — Encounter: Payer: Self-pay | Admitting: Internal Medicine

## 2024-03-09 VITALS — BP 118/80 | HR 53 | Temp 97.8°F | Ht 65.5 in | Wt 179.0 lb

## 2024-03-09 DIAGNOSIS — M79642 Pain in left hand: Secondary | ICD-10-CM

## 2024-03-09 MED ORDER — MELOXICAM 15 MG PO TABS: 15.0000 mg | ORAL_TABLET | Freq: Every day | ORAL | 0 refills | Status: AC

## 2024-03-09 NOTE — Progress Notes (Unsigned)
   Subjective:   Patient ID: Jordan Villa, male    DOB: 22-Jul-1961, 63 y.o.   MRN: [id number]  HPI The patient is a 63 YO male coming in for left hand pain. Started about 1 month ago and is stable and worsening slightly. New job in the last few months and uses hands daily which has caused pain.   Review of Systems  Constitutional: Negative.   HENT: Negative.    Eyes: Negative.   Respiratory:  Negative for cough, chest tightness and shortness of breath.   Cardiovascular:  Negative for chest pain, palpitations and leg swelling.  Gastrointestinal:  Negative for abdominal distention, abdominal pain, constipation, diarrhea, nausea and vomiting.  Musculoskeletal: Negative.   Skin: Negative.   Neurological: Negative.   Psychiatric/Behavioral: Negative.      Objective:  Physical Exam Constitutional:      Appearance: He is well-developed.  HENT:     Head: Normocephalic and atraumatic.  Cardiovascular:     Rate and Rhythm: Normal rate and regular rhythm.  Pulmonary:     Effort: Pulmonary effort is normal. No respiratory distress.     Breath sounds: Normal breath sounds. No wheezing or rales.  Abdominal:     General: Bowel sounds are normal. There is no distension.     Palpations: Abdomen is soft.     Tenderness: There is no abdominal tenderness. There is no rebound.  Musculoskeletal:        General: Swelling and tenderness present.     Cervical back: Normal range of motion.     Comments: 2-3 MCP joints left hand with swelling and tenderness, wrist without tenderness  Skin:    General: Skin is warm and dry.  Neurological:     Mental Status: He is alert and oriented to person, place, and time.     Coordination: Coordination normal.     Vitals:   03/09/24 1054  BP: 118/80  Pulse: (!) 53  Temp: 97.8 F (36.6 C)  TempSrc: Oral  SpO2: 98%  Weight: 179 lb (81.2 kg)  Height: 5' 5.5" (1.664 m)    Assessment & Plan:

## 2024-03-09 NOTE — Patient Instructions (Signed)
 We have sent in meloxicam to take daily for 1-2 weeks. We will check the x-ray.

## 2024-03-11 ENCOUNTER — Ambulatory Visit: Payer: Self-pay | Admitting: Internal Medicine

## 2024-03-11 DIAGNOSIS — M79642 Pain in left hand: Secondary | ICD-10-CM | POA: Insufficient documentation

## 2024-03-11 NOTE — Assessment & Plan Note (Addendum)
 Suspect either arthritis or tendonitis given location and new level of activity in the last few months. Checking x-ray hand to assess and rx meloxicam to help with inflammation and pain.

## 2024-08-17 ENCOUNTER — Emergency Department (HOSPITAL_BASED_OUTPATIENT_CLINIC_OR_DEPARTMENT_OTHER)
Admission: EM | Admit: 2024-08-17 | Discharge: 2024-08-17 | Disposition: A | Discharge status: Home / Self Care | Payer: Self-pay | Attending: Emergency Medicine | Admitting: Emergency Medicine

## 2024-08-17 ENCOUNTER — Other Ambulatory Visit: Payer: Self-pay

## 2024-08-17 DIAGNOSIS — Z23 Encounter for immunization: Secondary | ICD-10-CM | POA: Insufficient documentation

## 2024-08-17 DIAGNOSIS — W228XXA Striking against or struck by other objects, initial encounter: Secondary | ICD-10-CM | POA: Insufficient documentation

## 2024-08-17 DIAGNOSIS — S0101XA Laceration without foreign body of scalp, initial encounter: Secondary | ICD-10-CM | POA: Insufficient documentation

## 2024-08-17 DIAGNOSIS — T148XXA Other injury of unspecified body region, initial encounter: Secondary | ICD-10-CM

## 2024-08-17 MED ORDER — BACITRACIN ZINC 500 UNIT/GM EX OINT
TOPICAL_OINTMENT | Freq: Two times a day (BID) | CUTANEOUS | Status: DC
  Administered 2024-08-17: 1 via TOPICAL
  Filled 2024-08-17: qty 28.35

## 2024-08-17 MED ORDER — TETANUS-DIPHTH-ACELL PERTUSSIS 5-2-15.5 LF-MCG/0.5 IM SUSP
0.5000 mL | Freq: Once | INTRAMUSCULAR | Status: AC
  Administered 2024-08-17: 0.5 mL via INTRAMUSCULAR
  Filled 2024-08-17: qty 0.5

## 2024-08-17 NOTE — ED Notes (Signed)
Pt verbalized dc instructions

## 2024-08-17 NOTE — ED Provider Notes (Signed)
 [facility] AT [facility] Provider Note   CSN: [id number] Arrival date & time: 08/17/24  1545     Patient presents with: Head Laceration   Jordan Villa is a 63 y.o. male presents today for laceration to the top of his head after hitting his head while closing his car trunk.  Patient denies LOC, diplopia, tinnitus, nausea, vomiting, headache, photophobia, or any other complaints at this time.  Patient denies blood thinner use.    Head Laceration       Prior to Admission medications   Medication Sig Start Date End Date Taking? Authorizing Provider  meloxicam  (MOBIC ) 15 MG tablet Take 1 tablet (15 mg total) by mouth daily. 03/09/24   Rollene Almarie LABOR, MD    Allergies: Patient has no known allergies.    Review of Systems  Skin:  Positive for wound.    Updated Vital Signs BP (!) 149/79 (BP Location: Right Arm)   Pulse 61   Temp 98 F (36.7 C) (Oral)   Resp 19   Ht 5' 6 (1.676 m)   Wt 88.5 kg   SpO2 100%   BMI 31.47 kg/m   Physical Exam Vitals and nursing note reviewed.  Constitutional:      General: He is not in acute distress.    Appearance: Normal appearance. He is well-developed. He is not toxic-appearing.  HENT:     Head: Normocephalic.      Comments: Approximately 1-1/4 cm irregularly shaped superficial laceration noted to the frontal scalp.  Wound is hemostatic on exam.  There is no palpable hematoma noted.    Right Ear: External ear normal.     Left Ear: External ear normal.     Nose: Nose normal.     Mouth/Throat:     Mouth: Mucous membranes are moist.     Pharynx: Oropharynx is clear.  Eyes:     Extraocular Movements: Extraocular movements intact.     Conjunctiva/sclera: Conjunctivae normal.  Cardiovascular:     Rate and Rhythm: Normal rate and regular rhythm.     Heart sounds: No murmur heard. Pulmonary:     Effort: Pulmonary effort is normal. No respiratory distress.     Breath sounds: Normal breath sounds.   Abdominal:     Palpations: Abdomen is soft.     Tenderness: There is no abdominal tenderness.  Musculoskeletal:        General: No swelling.     Cervical back: Neck supple.  Skin:    General: Skin is warm and dry.     Capillary Refill: Capillary refill takes less than 2 seconds.  Neurological:     General: No focal deficit present.     Mental Status: He is alert and oriented to person, place, and time.     Cranial Nerves: No cranial nerve deficit.     Sensory: No sensory deficit.     Motor: No weakness.  Psychiatric:        Mood and Affect: Mood normal.     (all labs ordered are listed, but only abnormal results are displayed) Labs Reviewed - No data to display  EKG: None  Radiology: No results found.   Procedures   Medications Ordered in the ED  bacitracin ointment (has no administration in time range)  Tdap (ADACEL) injection 0.5 mL (has no administration in time range)  Medical Decision Making  This patient presents to the ED for concern of laceration differential diagnosis includes laceration, abrasion, hematoma, concussion, brain bleed    Additional history obtained   Additional history obtained from Electronic Medical Record External records from outside source obtained and reviewed including internal medicine notes   Medicines ordered and prescription drug management:  I ordered medication including Tdap and bacitracin    I have reviewed the patients home medicines and have made adjustments as needed   Problem List / ED Course:  Considered for admission or further workup however patient's vital signs physical exam are reassuring.  Patient has no signs or symptoms concerning for bleed or concussion.  Patient's wound was thoroughly irrigated while in ED.  Patient's tetanus was updated and topical bacitracin applied to wound.  Patient given return precautions.  I feel patient is safe for discharge at this time.       Final diagnoses:  Superficial laceration    ED Discharge Orders     None          Francis Ileana LOISE DEVONNA 08/17/24 GUILLERMO    Dreama Longs, MD 08/18/24 1539

## 2024-08-17 NOTE — ED Triage Notes (Signed)
 Pt POV after hitting head while trunk was closing, no LOC, no blood thinners, lac noted to top of head, bleeding controlled PTA.

## 2024-08-17 NOTE — Discharge Instructions (Signed)
 Today you were seen for a superficial laceration to your scalp.  You may alternate Tylenol/Motrin as needed for pain.  Please keep the area clean and dry.  Please return to the ED or urgent care/PCP if you have signs of infection to include puslike discharge, streaking redness around the wound, or fever that does not go down with Tylenol or Motrin.  Thank you for letting us  treat you today. After performing a physical exam, I feel you are safe to go home. Please follow up with your PCP in the next several days and provide them with your records from this visit. Return to the Emergency Room if pain becomes severe or symptoms worsen.
